# Patient Record
Sex: Female | Born: 1945 | Race: White | Hispanic: No | Marital: Married | Smoking: Never smoker
Health system: Southern US, Community
[De-identification: ages and names within clinical notes are randomized; demographics above are authoritative.]

## PROBLEM LIST (undated history)

## (undated) DIAGNOSIS — E119 Type 2 diabetes mellitus without complications: Secondary | ICD-10-CM

## (undated) DIAGNOSIS — M1990 Unspecified osteoarthritis, unspecified site: Secondary | ICD-10-CM

## (undated) DIAGNOSIS — I10 Essential (primary) hypertension: Secondary | ICD-10-CM

## (undated) DIAGNOSIS — I509 Heart failure, unspecified: Secondary | ICD-10-CM

---

## 2005-10-12 ENCOUNTER — Ambulatory Visit: Payer: Self-pay

## 2007-08-29 ENCOUNTER — Ambulatory Visit: Payer: Self-pay | Admitting: Orthopedic Surgery

## 2008-03-21 ENCOUNTER — Ambulatory Visit: Payer: Self-pay | Admitting: Ophthalmology

## 2008-03-24 ENCOUNTER — Ambulatory Visit: Payer: Self-pay | Admitting: Ophthalmology

## 2008-09-21 ENCOUNTER — Ambulatory Visit: Payer: Self-pay | Admitting: Rheumatology

## 2009-04-15 ENCOUNTER — Ambulatory Visit: Payer: Self-pay | Admitting: Orthopedic Surgery

## 2011-03-10 ENCOUNTER — Ambulatory Visit: Payer: Self-pay | Admitting: Family Medicine

## 2011-06-08 ENCOUNTER — Ambulatory Visit: Payer: Self-pay | Admitting: Family Medicine

## 2012-02-20 IMAGING — US US EXTREM LOW VENOUS*R*
1 series · 17 of 24 positions shown · non-contrast
Comparison: none

REASON FOR EXAM: STAT CR [id number] pain swelling eval for DVT
COMMENTS:

[Series 1: us extrem low venous*right* · 17 of 41 slices shown]
[im 1/41]
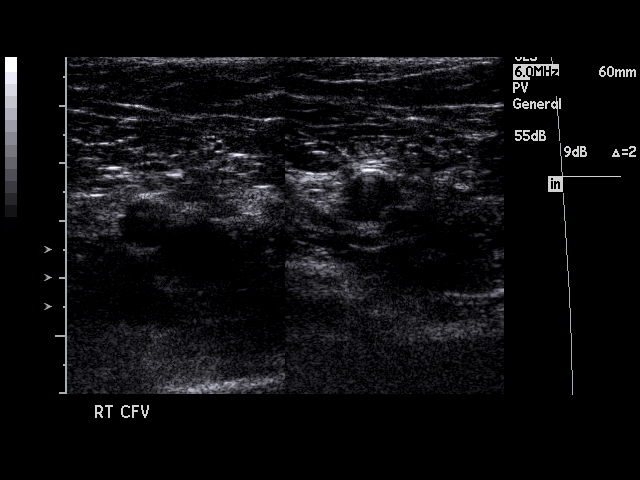
[im 4/41]
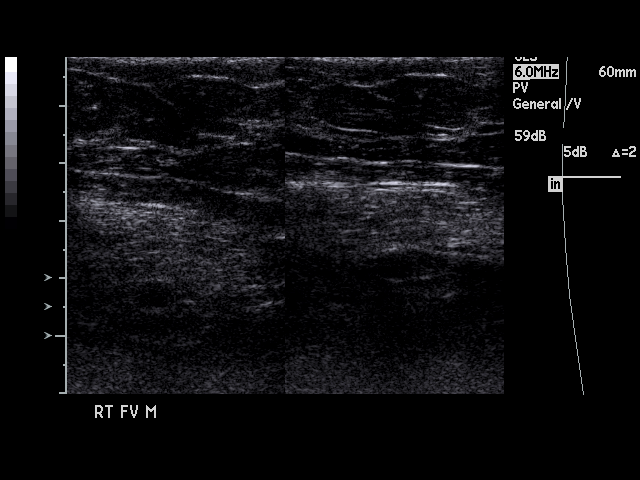
[im 6/41]
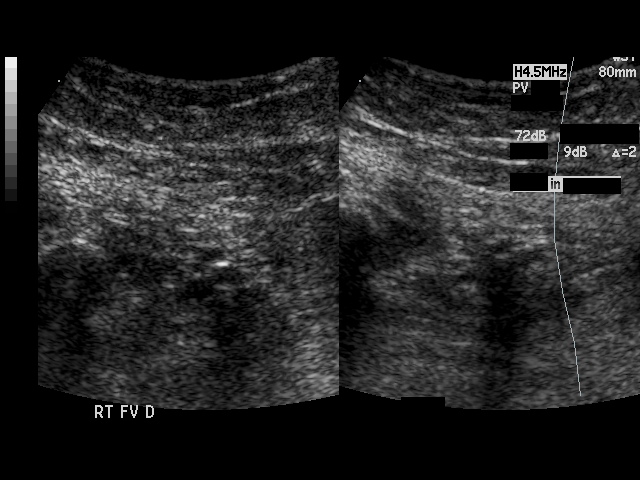
[im 7/41]
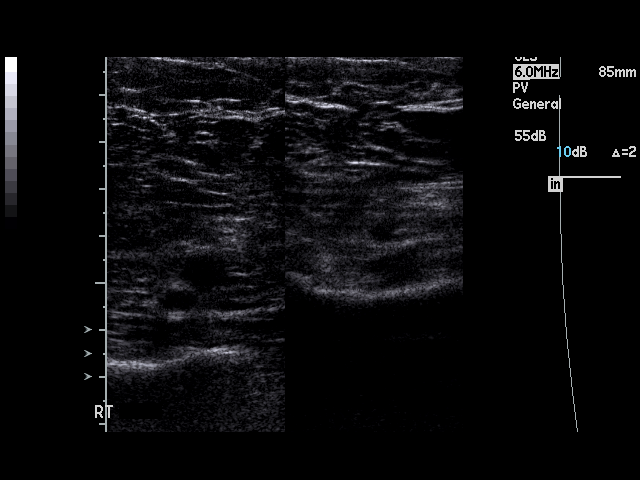
[im 11/41]
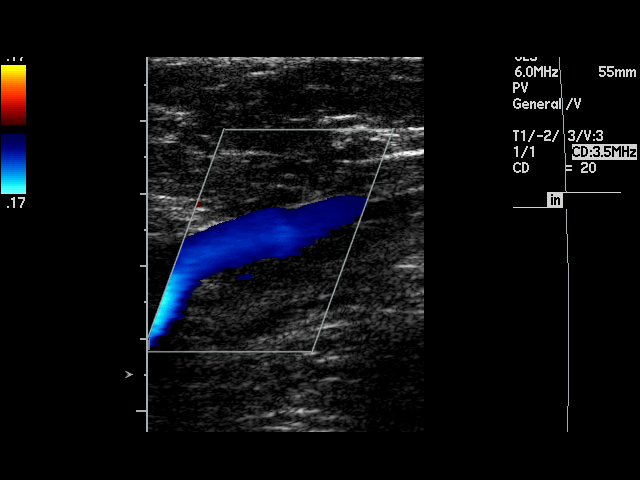
[im 13/41]
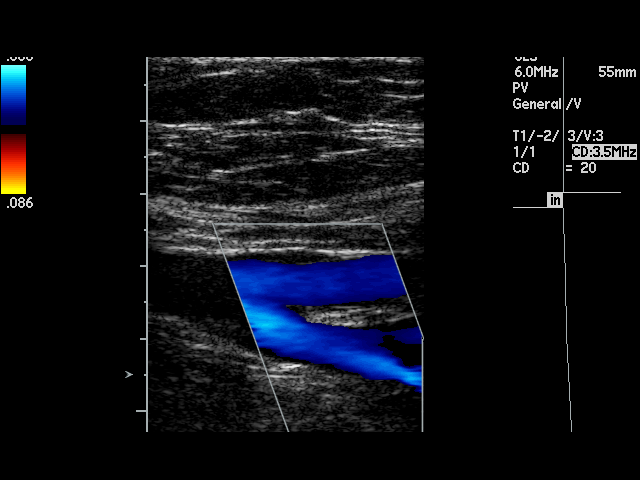
[im 16/41]
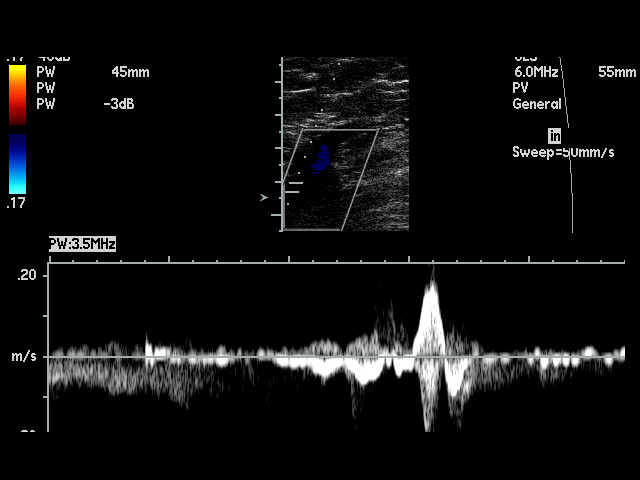
[im 18/41]
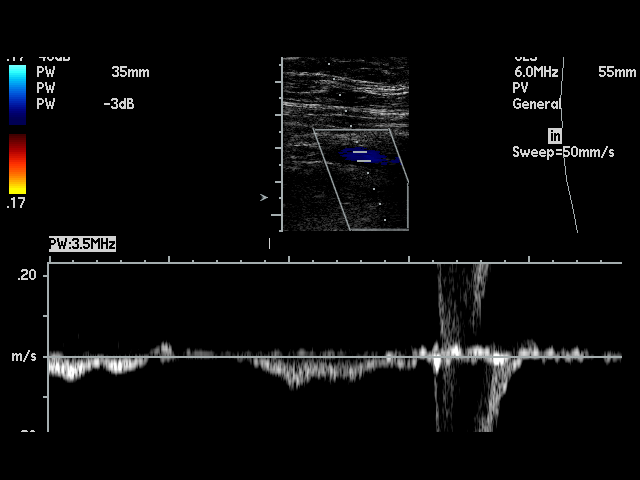
[im 21/41]
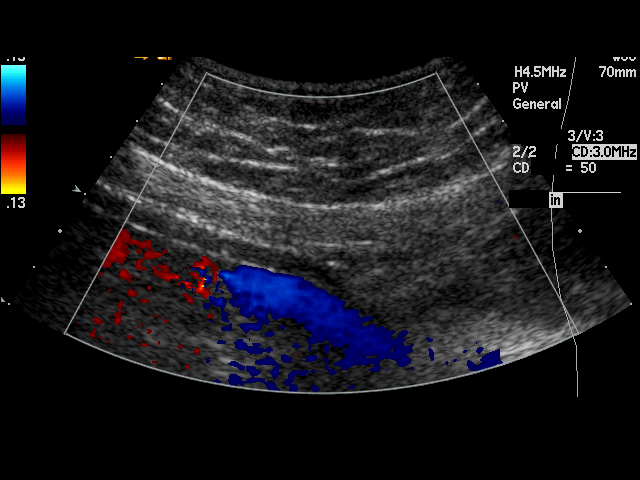
[im 23/41]
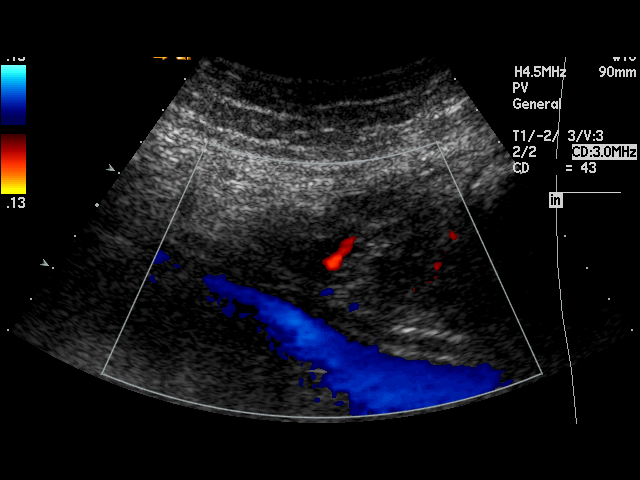
[im 25/41]
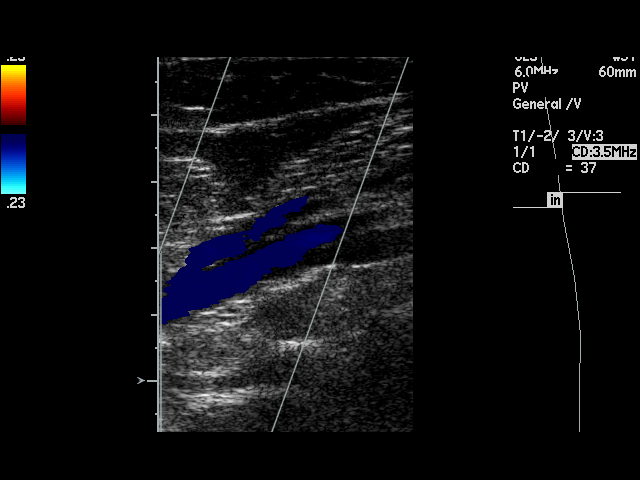
[im 28/41]
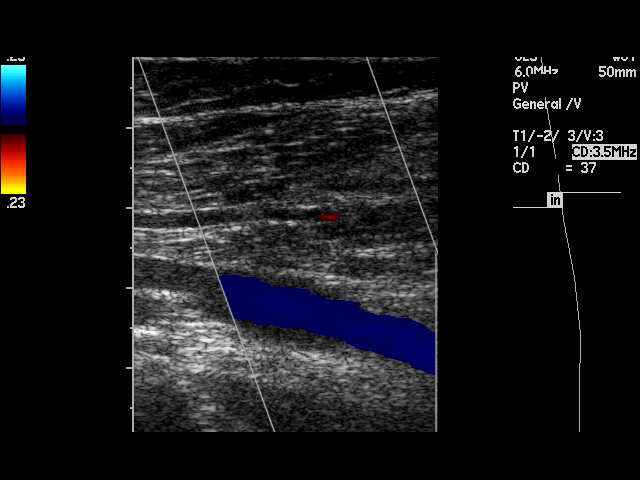
[im 30/41]
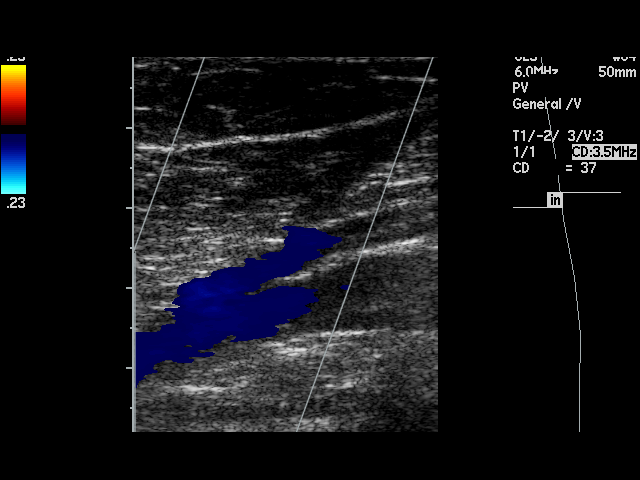
[im 34/41]
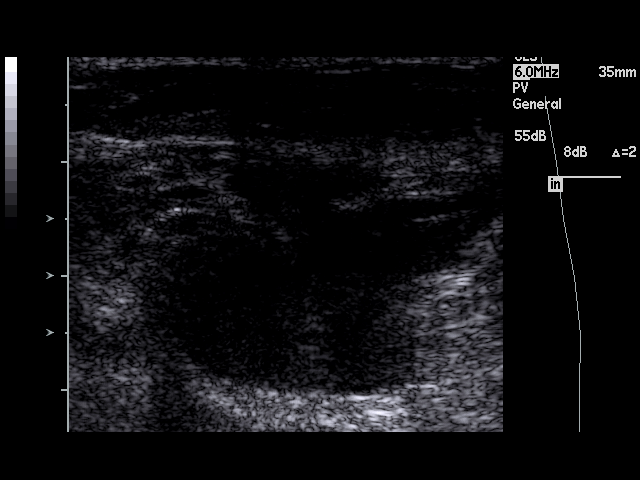
[im 35/41]
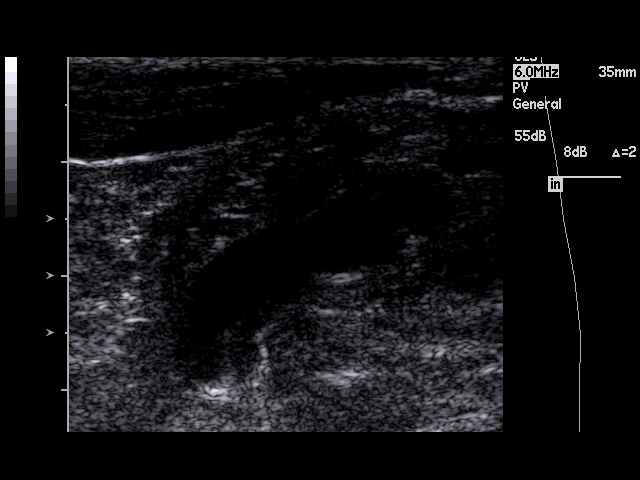
[im 37/41]
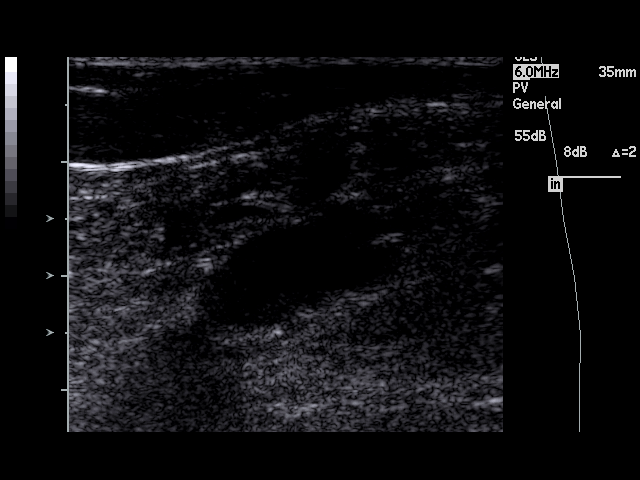
[im 41/41]
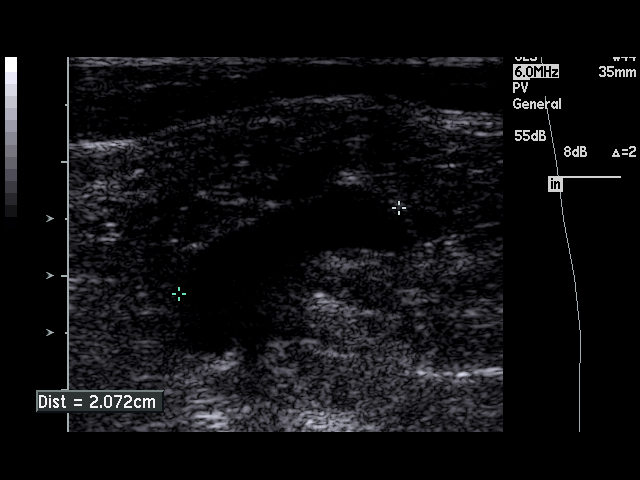

[17 of 24 positions shown; findings below may reference images not displayed]

PROCEDURE:     US  - US DOPPLER LOW EXTR RIGHT  - March 10, 2011 [DATE]

RESULT:     The phasic, augmentation and Valsalva flow waveforms are normal
in appearance. The femoral and popliteal vein shows complete compressibility
throughout its course. Doppler examination shows no occlusion or evidence
for deep vein thrombosis. There is observed a cystic structure in the
popliteal fossa consistent with a Baker's cyst that measures 3.3 cm x 1 cm x
2 cm.
IMPRESSION: 1.  No deep vein thrombosis is identified.
2.  There is observed a fluid collection posteriorly consistent with a
Baker's cyst.

## 2020-11-27 ENCOUNTER — Emergency Department: Payer: Medicare Other

## 2020-11-27 ENCOUNTER — Encounter: Payer: Self-pay | Admitting: Intensive Care

## 2020-11-27 ENCOUNTER — Emergency Department
Admission: EM | Admit: 2020-11-27 | Discharge: 2020-11-28 | Disposition: A | Discharge status: Other Acute Inpatient Hospital | Payer: Medicare Other | Attending: Emergency Medicine | Admitting: Emergency Medicine

## 2020-11-27 ENCOUNTER — Other Ambulatory Visit: Payer: Self-pay

## 2020-11-27 DIAGNOSIS — W19XXXA Unspecified fall, initial encounter: Secondary | ICD-10-CM | POA: Insufficient documentation

## 2020-11-27 DIAGNOSIS — Z20822 Contact with and (suspected) exposure to covid-19: Secondary | ICD-10-CM | POA: Diagnosis not present

## 2020-11-27 DIAGNOSIS — E875 Hyperkalemia: Secondary | ICD-10-CM | POA: Insufficient documentation

## 2020-11-27 DIAGNOSIS — Y92009 Unspecified place in unspecified non-institutional (private) residence as the place of occurrence of the external cause: Secondary | ICD-10-CM | POA: Diagnosis not present

## 2020-11-27 DIAGNOSIS — I509 Heart failure, unspecified: Secondary | ICD-10-CM | POA: Diagnosis not present

## 2020-11-27 DIAGNOSIS — E119 Type 2 diabetes mellitus without complications: Secondary | ICD-10-CM | POA: Diagnosis not present

## 2020-11-27 DIAGNOSIS — S80912A Unspecified superficial injury of left knee, initial encounter: Secondary | ICD-10-CM | POA: Diagnosis present

## 2020-11-27 DIAGNOSIS — Z950 Presence of cardiac pacemaker: Secondary | ICD-10-CM | POA: Diagnosis not present

## 2020-11-27 DIAGNOSIS — D649 Anemia, unspecified: Secondary | ICD-10-CM | POA: Diagnosis not present

## 2020-11-27 DIAGNOSIS — I110 Hypertensive heart disease with heart failure: Secondary | ICD-10-CM | POA: Diagnosis not present

## 2020-11-27 DIAGNOSIS — S72402A Unspecified fracture of lower end of left femur, initial encounter for closed fracture: Secondary | ICD-10-CM | POA: Insufficient documentation

## 2020-11-27 HISTORY — DX: Heart failure, unspecified: I50.9

## 2020-11-27 HISTORY — DX: Type 2 diabetes mellitus without complications: E11.9

## 2020-11-27 HISTORY — DX: Essential (primary) hypertension: I10

## 2020-11-27 HISTORY — DX: Unspecified osteoarthritis, unspecified site: M19.90

## 2020-11-27 MED ORDER — ACETAMINOPHEN 325 MG PO TABS
650.0000 mg | ORAL_TABLET | Freq: Once | ORAL | Status: AC
  Administered 2020-11-27: 650 mg via ORAL
  Filled 2020-11-27: qty 2

## 2020-11-27 NOTE — ED Notes (Signed)
Pt in need of urination, Bed pan used in triage 3 with new dry pad placed underneath pt.

## 2020-11-27 NOTE — ED Triage Notes (Signed)
Patient reports fall at home after her right knee buckled. Reports her husband was able to help her ease to the floor. C/o pain in left knee. Unable to stand and EMS had to lift onto stretcher and bring to ER. Denies hitting head or LOC

## 2020-11-28 LAB — PROTIME-INR
INR: 1.2 (ref 0.8–1.2)
Prothrombin Time: 14.7 seconds (ref 11.4–15.2)

## 2020-11-28 LAB — COMPREHENSIVE METABOLIC PANEL
ALT: 15 U/L (ref 0–44)
AST: 30 U/L (ref 15–41)
Albumin: 2.7 g/dL — ABNORMAL LOW (ref 3.5–5.0)
Alkaline Phosphatase: 90 U/L (ref 38–126)
Anion gap: 13 (ref 5–15)
BUN: 32 mg/dL — ABNORMAL HIGH (ref 8–23)
CO2: 28 mmol/L (ref 22–32)
Calcium: 8.9 mg/dL (ref 8.9–10.3)
Chloride: 105 mmol/L (ref 98–111)
Creatinine, Ser: 1.1 mg/dL — ABNORMAL HIGH (ref 0.44–1.00)
GFR, Estimated: 53 mL/min — ABNORMAL LOW (ref >60)
Glucose, Bld: 140 mg/dL — ABNORMAL HIGH (ref 70–99)
Potassium: 5.8 mmol/L — ABNORMAL HIGH (ref 3.5–5.1)
Sodium: 146 mmol/L — ABNORMAL HIGH (ref 135–145)
Total Bilirubin: 0.8 mg/dL (ref 0.3–1.2)
Total Protein: 6.6 g/dL (ref 6.5–8.1)

## 2020-11-28 LAB — TYPE AND SCREEN
ABO/RH(D): B POS
Antibody Screen: NEGATIVE

## 2020-11-28 LAB — CBC WITH DIFFERENTIAL/PLATELET
Abs Immature Granulocytes: 0.03 10*3/uL (ref 0.00–0.07)
Basophils Absolute: 0.1 10*3/uL (ref 0.0–0.1)
Basophils Relative: 1 %
Eosinophils Absolute: 0.3 10*3/uL (ref 0.0–0.5)
Eosinophils Relative: 3 %
HCT: 29.4 % — ABNORMAL LOW (ref 36.0–46.0)
Hemoglobin: 8.7 g/dL — ABNORMAL LOW (ref 12.0–15.0)
Immature Granulocytes: 0 %
Lymphocytes Relative: 9 %
Lymphs Abs: 0.8 10*3/uL (ref 0.7–4.0)
MCH: 28.5 pg (ref 26.0–34.0)
MCHC: 29.6 g/dL — ABNORMAL LOW (ref 30.0–36.0)
MCV: 96.4 fL (ref 80.0–100.0)
Monocytes Absolute: 0.5 10*3/uL (ref 0.1–1.0)
Monocytes Relative: 6 %
Neutro Abs: 7 10*3/uL (ref 1.7–7.7)
Neutrophils Relative %: 81 %
Platelets: 232 10*3/uL (ref 150–400)
RBC: 3.05 MIL/uL — ABNORMAL LOW (ref 3.87–5.11)
RDW: 13.7 % (ref 11.5–15.5)
WBC: 8.7 10*3/uL (ref 4.0–10.5)
nRBC: 0 % (ref 0.0–0.2)

## 2020-11-28 LAB — APTT: aPTT: 45 seconds — ABNORMAL HIGH (ref 24–36)

## 2020-11-28 LAB — POTASSIUM: Potassium: 5.6 mmol/L — ABNORMAL HIGH (ref 3.5–5.1)

## 2020-11-28 LAB — SARS CORONAVIRUS 2 BY RT PCR (HOSPITAL ORDER, PERFORMED IN ~~LOC~~ HOSPITAL LAB): SARS Coronavirus 2: NEGATIVE

## 2020-11-28 MED ORDER — SODIUM CHLORIDE 0.9 % IV BOLUS
500.0000 mL | Freq: Once | INTRAVENOUS | Status: AC
  Administered 2020-11-28: 500 mL via INTRAVENOUS

## 2020-11-28 MED ORDER — MORPHINE SULFATE (PF) 4 MG/ML IV SOLN
4.0000 mg | Freq: Once | INTRAVENOUS | Status: AC
  Administered 2020-11-28: 4 mg via INTRAVENOUS
  Filled 2020-11-28: qty 1

## 2020-11-28 MED ORDER — ONDANSETRON HCL 4 MG/2ML IJ SOLN
4.0000 mg | INTRAMUSCULAR | Status: AC
  Administered 2020-11-28: 4 mg via INTRAVENOUS
  Filled 2020-11-28: qty 2

## 2020-11-28 MED ORDER — FENTANYL CITRATE (PF) 100 MCG/2ML IJ SOLN
50.0000 ug | INTRAMUSCULAR | Status: DC | PRN
  Administered 2020-11-28: 50 ug via INTRAVENOUS
  Filled 2020-11-28: qty 2

## 2020-11-28 MED ORDER — SODIUM CHLORIDE 0.9 % IV BOLUS (SEPSIS)
500.0000 mL | Freq: Once | INTRAVENOUS | Status: AC
  Administered 2020-11-28: 500 mL via INTRAVENOUS

## 2020-11-28 NOTE — ED Notes (Signed)
Accepted to [facility] per [facility]

## 2020-11-28 NOTE — ED Notes (Signed)
Dr. Ward to subwait to see patient.  

## 2020-11-28 NOTE — ED Notes (Signed)
See paper transfer consent signature form.

## 2020-11-28 NOTE — ED Provider Notes (Signed)
[facility] Emergency Department Provider Note  ____________________________________________   Event Date/Time   First MD Initiated Contact with Patient 11/28/20 [phone]     (approximate)  I have reviewed the triage vital signs and the nursing notes.   HISTORY  Chief Complaint Fall    HPI Natalie Gomez is a 75 y.o. female with history of hypertension, diabetes, CHF s/p pacemaker who has had bilateral knee replacements who presents to the emergency department with left knee pain after a fall.  She states that she was getting up to go to the bathroom when she turned using her walker and her knees buckled and she went down to the ground with her legs underneath her.  She did not hit her head or lose consciousness.  She denies being on blood thinners.  Unable to ambulate since.  States her left knee was replaced at [facility] 30+ years ago.  Her right knee was replaced at [facility] by Dr. Dolores Lory in August 2020 and was complicated by MSSA infection requiring DAIR and antibiotics.        Past Medical History:  Diagnosis Date  . Arthritis   . CHF (congestive heart failure) (HCC)   . Diabetes mellitus without complication (HCC)   . Hypertension     There are no problems to display for this patient.   History reviewed. No pertinent surgical history.  Prior to Admission medications   Not on File    Allergies Prednisone  History reviewed. No pertinent family history.  Social History Social History   Tobacco Use  . Smoking status: Never Smoker  . Smokeless tobacco: Never Used  Substance Use Topics  . Alcohol use: Yes    Comment: occ  . Drug use: Yes    Comment: prescribed hydrocodone by pain management    Review of Systems Constitutional: No fever. Eyes: No visual changes. ENT: No sore throat. Cardiovascular: Denies chest pain. Respiratory: Denies shortness of breath. Gastrointestinal: No nausea, vomiting,  diarrhea. Genitourinary: Negative for dysuria. Musculoskeletal: Negative for back pain. Skin: Negative for rash. Neurological: Negative for focal weakness or numbness.  ____________________________________________   PHYSICAL EXAM:  VITAL SIGNS: ED Triage Vitals  Enc Vitals Group     BP 11/27/20 1754 (!) 117/50     Pulse Rate 11/27/20 1754 65     Resp 11/27/20 1754 16     Temp 11/27/20 1754 97.8 F (36.6 C)     Temp Source 11/27/20 1754 Oral     SpO2 11/27/20 1754 93 %     Weight 11/27/20 1757 192 lb (87.1 kg)     Height 11/27/20 1757 4\' 11"  (1.499 m)     Head Circumference --      Peak Flow --      Pain Score 11/27/20 1757 10     Pain Loc --      Pain Edu? --      Excl. in GC? --    CONSTITUTIONAL: Alert and oriented and responds appropriately to questions. Well-appearing; well-nourished HEAD: Normocephalic EYES: Conjunctivae clear, pupils appear equal, EOM appear intact ENT: normal nose; moist mucous membranes NECK: Supple, normal ROM, no midline spinal tenderness or step-off or deformity CARD: RRR; S1 and S2 appreciated; no murmurs, no clicks, no rubs, no gallops RESP: Normal chest excursion without splinting or tachypnea; breath sounds clear and equal bilaterally; no wheezes, no rhonchi, no rales, no hypoxia or respiratory distress, speaking full sentences ABD/GI: Normal bowel sounds; non-distended; soft, non-tender, no rebound, no  guarding, no peritoneal signs, no hepatosplenomegaly BACK: The back appears normal, no midline spinal tenderness or step-off or deformity EXT: Left knee pain without obvious deformity or large joint effusion.  No redness or warmth.  2+ DP pulses bilaterally.  Compartments soft.  Normal sensation in both lower extremities. SKIN: Normal color for age and race; warm; no rash on exposed skin NEURO: Moves all extremities equally, normal speech, no facial asymmetry PSYCH: The patient's mood and manner are  appropriate.  ____________________________________________   LABS (all labs ordered are listed, but only abnormal results are displayed)  Labs Reviewed  COMPREHENSIVE METABOLIC PANEL - Abnormal; Notable for the following components:      Result Value   Sodium 146 (*)    Potassium 5.8 (*)    Glucose, Bld 140 (*)    BUN 32 (*)    Creatinine, Ser 1.10 (*)    Albumin 2.7 (*)    GFR, Estimated 53 (*)    All other components within normal limits  CBC WITH DIFFERENTIAL/PLATELET - Abnormal; Notable for the following components:   RBC 3.05 (*)    Hemoglobin 8.7 (*)    HCT 29.4 (*)    MCHC 29.6 (*)    All other components within normal limits  APTT - Abnormal; Notable for the following components:   aPTT 45 (*)    All other components within normal limits  POTASSIUM - Abnormal; Notable for the following components:   Potassium 5.6 (*)    All other components within normal limits  SARS CORONAVIRUS 2 BY RT PCR (HOSPITAL ORDER, PERFORMED IN [facility])  PROTIME-INR  TYPE AND SCREEN   ____________________________________________  EKG  none ____________________________________________  RADIOLOGY I, Mariska Daffin, personally viewed and evaluated these images (plain radiographs) as part of my medical decision making, as well as reviewing the written report by the radiologist.  ED MD interpretation: Patient has an acute periprosthetic fracture of the distal left femur without significant displacement or angulation.  Official radiology report(s): DG Knee Complete 4 Views Left  Result Date: 11/27/2020 CLINICAL DATA:  Fall EXAM: LEFT KNEE - COMPLETE 4+ VIEW COMPARISON:  None. FINDINGS: Status post left total knee arthroplasty. Arthroplasty components are in their expected alignment without dislocation. There is an acute periprosthetic fracture of the distal femur with oblique component extending into the distal diaphysis. No significant displacement or angulation. Bones are  demineralized. No periprosthetic lucency or fracture associated with the tibial component. Prominent vascular calcifications. IMPRESSION: Acute periprosthetic fracture of the distal femur without significant displacement or angulation. Electronically Signed   By: Duanne Guess D.O.   On: 11/27/2020 19:36    ____________________________________________   PROCEDURES  Procedure(s) performed (including Critical Care):  Procedures   ____________________________________________   INITIAL IMPRESSION / ASSESSMENT AND PLAN / ED COURSE  As part of my medical decision making, I reviewed the following data within the electronic MEDICAL RECORD NUMBER Nursing notes reviewed and incorporated, Labs reviewed showing mild hyperkalemia, Radiograph reviewed showing distal periprosthetic left femur fracture, A consult was requested and obtained from this/these consultant(s) Orthopedics and Notes from prior ED visits         Patient here with mechanical fall.  Has a acute periprosthetic fracture of the distal left femur.  Neurovascular intact distally.  No other injury on exam.  Discussed case with Dr. Allena Katz on-call for orthopedic surgery.  He states that this will be patient may need to be transferred given he has not managed a periprosthetic fracture in many  years.  Discussed this with patient and husband.  They would like transfer to [facility] if possible.  Will discuss with [facility] orthopedics.  She is well-known to the [facility] system as she had her right knee replaced and there in 2020 and has been followed by orthopedics as well as infectious disease.    3:45 AM  Spoke with [facility] transfer center.  Dr. Remer Macho with orthopedics is excepting physician.  They recommend transfer ED to ED.  Patient updated with plan.   Labs show hemoglobin of 8.7.  This appears to be chronic per records at [facility] with last hemoglobin of 8.4 on 10/22/2020.  Potassium slightly elevated at 5.8 but this has hemolyzed.  We will repeat.  Her COVID  test is pending.  5:15 AM  Pt's repeat potassium is 5.6.  Will obtain EKG and continue IV hydration.  COVID test negative.  5:30 AM  Pt's EKG shows paced rhythm.  Patient stable for transport to [facility].  Transfer team at bedside.   I reviewed all nursing notes and pertinent previous records as available.  I have reviewed and interpreted any EKGs, lab and urine results, imaging (as available).  ____________________________________________   FINAL CLINICAL IMPRESSION(S) / ED DIAGNOSES  Final diagnoses:  Closed fracture of distal end of left femur, unspecified fracture morphology, initial encounter (HCC)  Hyperkalemia  Anemia, unspecified type     ED Discharge Orders    None      *Please note:  Natalie Gomez was evaluated in Emergency Department on 11/28/2020 for the symptoms described in the history of present illness. She was evaluated in the context of the global COVID-19 pandemic, which necessitated consideration that the patient might be at risk for infection with the SARS-CoV-2 virus that causes COVID-19. Institutional protocols and algorithms that pertain to the evaluation of patients at risk for COVID-19 are in a state of rapid change based on information released by regulatory bodies including the CDC and federal and state organizations. These policies and algorithms were followed during the patient's care in the ED.  Some ED evaluations and interventions may be delayed as a result of limited staffing during and the pandemic.*   Note:  This document was prepared using Dragon voice recognition software and may include unintentional dictation errors.   Yani Coventry, Layla Maw, DO 11/28/20 [phone]

## 2020-11-28 NOTE — ED Notes (Signed)
Xray called to [facility] to [facility]

## 2022-07-07 DEATH — deceased
